# Patient Record
Sex: Female | Born: 2000 | Race: White | Hispanic: No | Marital: Single | Smoking: Never smoker
Health system: Southern US, Community
[De-identification: ages and names within clinical notes are randomized; demographics above are authoritative.]

## PROBLEM LIST (undated history)

## (undated) DIAGNOSIS — R569 Unspecified convulsions: Secondary | ICD-10-CM

## (undated) DIAGNOSIS — J45909 Unspecified asthma, uncomplicated: Secondary | ICD-10-CM

## (undated) DIAGNOSIS — L309 Dermatitis, unspecified: Secondary | ICD-10-CM

## (undated) DIAGNOSIS — F909 Attention-deficit hyperactivity disorder, unspecified type: Secondary | ICD-10-CM

---
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
## 2021-08-17 ENCOUNTER — Emergency Department (HOSPITAL_BASED_OUTPATIENT_CLINIC_OR_DEPARTMENT_OTHER): Payer: BC Managed Care – PPO

## 2021-08-17 ENCOUNTER — Encounter (HOSPITAL_BASED_OUTPATIENT_CLINIC_OR_DEPARTMENT_OTHER): Payer: Self-pay | Admitting: Emergency Medicine

## 2021-08-17 ENCOUNTER — Emergency Department (HOSPITAL_BASED_OUTPATIENT_CLINIC_OR_DEPARTMENT_OTHER)
Admission: EM | Admit: 2021-08-17 | Discharge: 2021-08-17 | Disposition: A | Discharge status: Home / Self Care | Payer: BC Managed Care – PPO | Attending: Emergency Medicine | Admitting: Emergency Medicine

## 2021-08-17 ENCOUNTER — Other Ambulatory Visit: Payer: Self-pay

## 2021-08-17 DIAGNOSIS — W208XXA Other cause of strike by thrown, projected or falling object, initial encounter: Secondary | ICD-10-CM | POA: Insufficient documentation

## 2021-08-17 DIAGNOSIS — S92414A Nondisplaced fracture of proximal phalanx of right great toe, initial encounter for closed fracture: Secondary | ICD-10-CM | POA: Diagnosis not present

## 2021-08-17 DIAGNOSIS — S99921A Unspecified injury of right foot, initial encounter: Secondary | ICD-10-CM | POA: Diagnosis present

## 2021-08-17 MED ORDER — NAPROXEN 375 MG PO TABS: 375.0000 mg | ORAL_TABLET | Freq: Two times a day (BID) | ORAL | 0 refills | Status: AC

## 2021-08-17 MED ORDER — ACETAMINOPHEN 325 MG PO TABS
650.0000 mg | ORAL_TABLET | Freq: Once | ORAL | Status: AC | PRN
  Administered 2021-08-17: 650 mg via ORAL
  Filled 2021-08-17: qty 2

## 2021-08-17 NOTE — ED Triage Notes (Signed)
Pt arrives pov, to triage in wheelchair, c/o right great toe injury r/t heavy box dropping on toe. Swelling and bruising noted

## 2021-08-17 NOTE — Discharge Instructions (Signed)
The x-rays showed a fracture of the bone in your big toe.  Use the hard soled shoe for comfort and support.  Use the crutches to help take the weight off of your toe.  Follow-up with your orthopedic doctor for further treatment to make sure the fracture heals properly

## 2021-08-17 NOTE — ED Provider Notes (Signed)
[facility] EMERGENCY DEPARTMENT Provider Note   CSN: [id number] Arrival date & time: 08/17/21  1358     History  Chief Complaint  Patient presents with   Toe Injury    Claudia Brock is a 20 y.o. female.  HPI  Patient presents to the ED for evaluation of a toe injury.  Patient states she dropped a heavy box landing on her right big toe.  Patient is having pain at the base of the big toe.  It hurts to walk and put weight and she also started to notice a bruise developing.  Home Medications Prior to Admission medications   Medication Sig Start Date End Date Taking? Authorizing Provider  naproxen (NAPROSYN) 375 MG tablet Take 1 tablet (375 mg total) by mouth 2 (two) times daily. 08/17/21  Yes Linwood Dibbles, MD  beclomethasone (QVAR) 80 MCG/ACT inhaler Inhale 2 puffs into the lungs 2 (two) times daily.    [provider]  cephALEXin (KEFLEX) 500 MG capsule Take 1 capsule (500 mg total) by mouth 2 (two) times daily. 03/06/18   Garlon Hatchet, PA-C  cetirizine (ZYRTEC) 10 MG tablet Take 10 mg by mouth daily.    [provider]  guaiFENesin-dextromethorphan (ROBITUSSIN DM) 100-10 MG/5ML syrup Take 5 mLs by mouth every 4 (four) hours as needed for cough. 10/26/14   Felicie Morn, NP  montelukast (SINGULAIR) 10 MG tablet Take 10 mg by mouth at bedtime.    [provider]  phenazopyridine (PYRIDIUM) 200 MG tablet Take 1 tablet (200 mg total) by mouth 3 (three) times daily as needed for pain. 03/06/18   Garlon Hatchet, PA-C      Allergies    Ritalin [methylphenidate hcl]    Review of Systems   Review of Systems  Physical Exam Updated Vital Signs BP 128/78   Pulse 92   Temp 100.2 F (37.9 C) (Oral)   Resp 20   Ht 1.638 m (5' 4.5")   Wt 55.8 kg   SpO2 96%   BMI 20.79 kg/m  Physical Exam Vitals and nursing note reviewed.  Constitutional:      General: She is not in acute distress.    Appearance: She is well-developed.  HENT:     Head:  Normocephalic and atraumatic.     Right Ear: External ear normal.     Left Ear: External ear normal.  Eyes:     General: No scleral icterus.       Right eye: No discharge.        Left eye: No discharge.     Conjunctiva/sclera: Conjunctivae normal.  Neck:     Trachea: No tracheal deviation.  Cardiovascular:     Rate and Rhythm: Normal rate.  Pulmonary:     Effort: Pulmonary effort is normal. No respiratory distress.     Breath sounds: No stridor.  Abdominal:     General: There is no distension.  Musculoskeletal:        General: Tenderness present. No swelling or deformity.     Cervical back: Neck supple.     Comments: Bruising noted at the base of the big toe, dark toenail polish but no evidence of subungual hematoma noted at the base of the toenail where there was noted toenail polish, no laceration, no gross deformity, cap refill brisk  Skin:    General: Skin is warm and dry.     Findings: No rash.  Neurological:     Mental Status: She is alert.  Cranial Nerves: Cranial nerve deficit: no gross deficits.     ED Results / Procedures / Treatments   Labs (all labs ordered are listed, but only abnormal results are displayed) Labs Reviewed - No data to display  EKG None  Radiology DG Foot Complete Right  Result Date: 08/17/2021 CLINICAL DATA:  Dropped a box on great toe last night EXAM: RIGHT FOOT COMPLETE - 3+ VIEW COMPARISON:  None Available. FINDINGS: There is a nondisplaced fracture of the great toe proximal phalanx at the interphalangeal joint, along the lateral aspect with joint involvement. Normal joint alignment. There is adjacent soft tissue swelling. IMPRESSION: Nondisplaced fracture of the great toe proximal phalanx at the interphalangeal joint. Electronically Signed   By: Caprice Renshaw M.D.   On: 08/17/2021 14:40    Procedures Procedures    Medications Ordered in ED Medications  acetaminophen (TYLENOL) tablet 650 mg (650 mg Oral Given 08/17/21 1422)    ED  Course/ Medical Decision Making/ A&P                           Medical Decision Making Problems Addressed: Closed nondisplaced fracture of proximal phalanx of right great toe, initial encounter: acute illness or injury  Amount and/or Complexity of Data Reviewed Radiology: ordered and independent interpretation performed.    Details: Nondisplaced fracture noted of the proximal phalanx of the great toe  Risk OTC drugs. Prescription drug management.  X-rays show evidence of proximal phalanx fracture of the big toe.  Will buddy tape, provide postop shoe and crutches.  Outpatient follow-up with orthopedics Evaluation and diagnostic testing in the emergency department does not suggest an emergent condition requiring admission or immediate intervention beyond what has been performed at this time.  The patient is safe for discharge and has been instructed to return immediately for worsening symptoms, change in symptoms or any other concerns.         Final Clinical Impression(s) / ED Diagnoses Final diagnoses:  Closed nondisplaced fracture of proximal phalanx of right great toe, initial encounter    Rx / DC Orders ED Discharge Orders          Ordered    naproxen (NAPROSYN) 375 MG tablet  2 times daily        08/17/21 1445              Linwood Dibbles, MD 08/17/21 1448

## 2021-09-07 ENCOUNTER — Encounter (HOSPITAL_BASED_OUTPATIENT_CLINIC_OR_DEPARTMENT_OTHER): Payer: Self-pay | Admitting: Emergency Medicine

## 2021-09-07 ENCOUNTER — Other Ambulatory Visit: Payer: Self-pay

## 2021-09-07 ENCOUNTER — Emergency Department (HOSPITAL_BASED_OUTPATIENT_CLINIC_OR_DEPARTMENT_OTHER)
Admission: EM | Admit: 2021-09-07 | Discharge: 2021-09-07 | Disposition: A | Discharge status: Home / Self Care | Payer: BC Managed Care – PPO | Attending: Emergency Medicine | Admitting: Emergency Medicine

## 2021-09-07 DIAGNOSIS — N898 Other specified noninflammatory disorders of vagina: Secondary | ICD-10-CM | POA: Diagnosis present

## 2021-09-07 DIAGNOSIS — B9689 Other specified bacterial agents as the cause of diseases classified elsewhere: Secondary | ICD-10-CM

## 2021-09-07 DIAGNOSIS — N760 Acute vaginitis: Secondary | ICD-10-CM | POA: Insufficient documentation

## 2021-09-07 LAB — WET PREP, GENITAL
Sperm: NONE SEEN
Trich, Wet Prep: NONE SEEN
WBC, Wet Prep HPF POC: >=10 — AB (ref <10)
Yeast Wet Prep HPF POC: NONE SEEN

## 2021-09-07 LAB — URINALYSIS, ROUTINE W REFLEX MICROSCOPIC
Bilirubin Urine: NEGATIVE
Glucose, UA: NEGATIVE mg/dL
Ketones, ur: NEGATIVE mg/dL
Nitrite: NEGATIVE
Protein, ur: NEGATIVE mg/dL
Specific Gravity, Urine: 1.025 (ref 1.005–1.030)
pH: 6.5 (ref 5.0–8.0)

## 2021-09-07 LAB — URINALYSIS, MICROSCOPIC (REFLEX): WBC, UA: >50 WBC/hpf (ref 0–5)

## 2021-09-07 LAB — PREGNANCY, URINE: Preg Test, Ur: NEGATIVE

## 2021-09-07 MED ORDER — METRONIDAZOLE 500 MG PO TABS: 500.0000 mg | ORAL_TABLET | Freq: Two times a day (BID) | ORAL | 0 refills | Status: DC

## 2021-09-07 MED ORDER — METRONIDAZOLE 500 MG PO TABS
500.0000 mg | ORAL_TABLET | Freq: Once | ORAL | Status: AC
  Administered 2021-09-07: 500 mg via ORAL
  Filled 2021-09-07: qty 1

## 2021-09-07 NOTE — ED Triage Notes (Signed)
Pt arrives pov, steady gait, c/o vaginal d/c and odor x 2 weeks.

## 2021-09-07 NOTE — ED Provider Notes (Signed)
[facility] EMERGENCY DEPARTMENT Provider Note   CSN: [id number] Arrival date & time: 09/07/21  6546     History  Chief Complaint  Patient presents with   Vaginal Discharge    Claudia Brock is a 21 y.o. female presenting with 2 weeks worth of vaginal discharge and odor.  Sexually active with 1 partner.  Has had recent STD testing that was negative.  No abdominal pain, cramping or abnormal menses.  No urinary symptoms.  Vaginal Discharge      Home Medications Prior to Admission medications   Medication Sig Start Date End Date Taking? Authorizing Provider  beclomethasone (QVAR) 80 MCG/ACT inhaler Inhale 2 puffs into the lungs 2 (two) times daily.    [provider]  cephALEXin (KEFLEX) 500 MG capsule Take 1 capsule (500 mg total) by mouth 2 (two) times daily. 03/06/18   Garlon Hatchet, PA-C  cetirizine (ZYRTEC) 10 MG tablet Take 10 mg by mouth daily.    [provider]  guaiFENesin-dextromethorphan (ROBITUSSIN DM) 100-10 MG/5ML syrup Take 5 mLs by mouth every 4 (four) hours as needed for cough. 10/26/14   Felicie Morn, NP  montelukast (SINGULAIR) 10 MG tablet Take 10 mg by mouth at bedtime.    [provider]  naproxen (NAPROSYN) 375 MG tablet Take 1 tablet (375 mg total) by mouth 2 (two) times daily. 08/17/21   Linwood Dibbles, MD  phenazopyridine (PYRIDIUM) 200 MG tablet Take 1 tablet (200 mg total) by mouth 3 (three) times daily as needed for pain. 03/06/18   Garlon Hatchet, PA-C      Allergies    Ritalin [methylphenidate hcl]    Review of Systems   Review of Systems  Genitourinary:  Positive for vaginal discharge.    Physical Exam Updated Vital Signs BP 126/88 (BP Location: Right Arm)   Pulse 96   Temp 99.8 F (37.7 C) (Oral)   Resp 16   Wt 55.8 kg   LMP 08/28/2021   SpO2 96%   BMI 20.79 kg/m  Physical Exam Vitals and nursing note reviewed.  Constitutional:      Appearance: Normal appearance.  HENT:     Head: Normocephalic and  atraumatic.  Eyes:     General: No scleral icterus.    Conjunctiva/sclera: Conjunctivae normal.  Pulmonary:     Effort: Pulmonary effort is normal. No respiratory distress.  Abdominal:     General: Abdomen is flat.     Palpations: Abdomen is soft.     Tenderness: There is no abdominal tenderness.  Genitourinary:    Comments: GU exam deferred.  Offered but declined. Skin:    Findings: No rash.  Neurological:     Mental Status: She is alert.  Psychiatric:        Mood and Affect: Mood normal.     ED Results / Procedures / Treatments   Labs (all labs ordered are listed, but only abnormal results are displayed) Labs Reviewed  WET PREP, GENITAL - Abnormal; Notable for the following components:      Result Value   Clue Cells Wet Prep HPF POC PRESENT (*)    WBC, Wet Prep HPF POC >=10 (*)    All other components within normal limits  URINALYSIS, ROUTINE W REFLEX MICROSCOPIC - Abnormal; Notable for the following components:   Hgb urine dipstick TRACE (*)    Leukocytes,Ua SMALL (*)    All other components within normal limits  URINALYSIS, MICROSCOPIC (REFLEX) - Abnormal; Notable for the following components:  Bacteria, UA MANY (*)    All other components within normal limits  PREGNANCY, URINE  GC/CHLAMYDIA PROBE AMP ([place]) NOT AT [facility]    EKG None  Radiology No results found.  Procedures Procedures   Medications Ordered in ED Medications  metroNIDAZOLE (FLAGYL) tablet 500 mg (500 mg Oral Given 09/07/21 2014)    ED Course/ Medical Decision Making/ A&P                           Medical Decision Making Amount and/or Complexity of Data Reviewed Labs: ordered.  Risk Prescription drug management.   21 year old female presenting today with vaginal discharge.  Reports it is copious and odorous.  Differential includes but is not limited to BV, yeast infection, STI/PID/TOA.  Physical exam offered but deferred.  Patient self swabbed.  Swabs ordered and reviewed by  me.  Clue cells are negative for BV.  UA negative  Treatment: Given first dose of metronidazole and will be discharged with the remainder of the course.  Return precautions discussed.  Final Clinical Impression(s) / ED Diagnoses Final diagnoses:  BV (bacterial vaginosis)    Rx / DC Orders ED Discharge Orders     None     Results and diagnoses were explained to the patient. Return precautions discussed in full. Patient had no additional questions and expressed complete understanding.   This chart was dictated using voice recognition software.  Despite best efforts to proofread,  errors can occur which can change the documentation meaning.     Saddie Benders, PA-C 09/07/21 2019    Tanda Rockers A, DO 09/09/21 [phone]

## 2021-09-07 NOTE — Discharge Instructions (Addendum)
Read the information about bacterial vaginosis attached to these discharge papers.  Take the antibiotic as prescribed.  It may upset your stomach so please take it with food.  It is very important that you do not drink alcohol with this antibiotic.  Return with any worsening symptoms, otherwise please follow-up with your GYN.

## 2021-09-08 LAB — GC/CHLAMYDIA PROBE AMP (~~LOC~~) NOT AT ARMC
Chlamydia: NEGATIVE
Comment: NEGATIVE
Comment: NORMAL
Neisseria Gonorrhea: NEGATIVE

## 2022-03-09 ENCOUNTER — Ambulatory Visit: Payer: BC Managed Care – PPO | Admitting: Behavioral Health

## 2022-03-12 ENCOUNTER — Ambulatory Visit (INDEPENDENT_AMBULATORY_CARE_PROVIDER_SITE_OTHER): Payer: BC Managed Care – PPO | Admitting: Behavioral Health

## 2022-03-12 DIAGNOSIS — F331 Major depressive disorder, recurrent, moderate: Secondary | ICD-10-CM

## 2022-03-12 DIAGNOSIS — F4310 Post-traumatic stress disorder, unspecified: Secondary | ICD-10-CM | POA: Diagnosis not present

## 2022-03-12 DIAGNOSIS — F411 Generalized anxiety disorder: Secondary | ICD-10-CM | POA: Diagnosis not present
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
## 2022-03-16 ENCOUNTER — Encounter: Payer: Self-pay | Admitting: Behavioral Health

## 2022-03-17 ENCOUNTER — Ambulatory Visit (INDEPENDENT_AMBULATORY_CARE_PROVIDER_SITE_OTHER): Payer: BC Managed Care – PPO | Admitting: Psychiatry

## 2022-03-17 VITALS — BP 132/87 | HR 95 | Ht 67.0 in | Wt 116.0 lb

## 2022-03-17 DIAGNOSIS — F331 Major depressive disorder, recurrent, moderate: Secondary | ICD-10-CM | POA: Diagnosis not present

## 2022-03-17 DIAGNOSIS — F4310 Post-traumatic stress disorder, unspecified: Secondary | ICD-10-CM | POA: Diagnosis not present

## 2022-03-17 DIAGNOSIS — F411 Generalized anxiety disorder: Secondary | ICD-10-CM

## 2022-03-17 MED ORDER — MIRTAZAPINE 15 MG PO TABS
15.0000 mg | ORAL_TABLET | Freq: Every day | ORAL | 1 refills | Status: DC
Start: 2022-03-17 — End: 2022-04-09

## 2022-03-18 ENCOUNTER — Encounter: Payer: Self-pay | Admitting: Psychiatry

## 2022-03-18 DIAGNOSIS — F4310 Post-traumatic stress disorder, unspecified: Secondary | ICD-10-CM | POA: Insufficient documentation

## 2022-03-18 DIAGNOSIS — F331 Major depressive disorder, recurrent, moderate: Secondary | ICD-10-CM | POA: Insufficient documentation

## 2022-03-18 DIAGNOSIS — F411 Generalized anxiety disorder: Secondary | ICD-10-CM | POA: Insufficient documentation
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
## 2022-03-20 ENCOUNTER — Ambulatory Visit (INDEPENDENT_AMBULATORY_CARE_PROVIDER_SITE_OTHER): Payer: BC Managed Care – PPO | Admitting: Behavioral Health

## 2022-03-20 DIAGNOSIS — F909 Attention-deficit hyperactivity disorder, unspecified type: Secondary | ICD-10-CM | POA: Diagnosis not present

## 2022-03-20 DIAGNOSIS — F331 Major depressive disorder, recurrent, moderate: Secondary | ICD-10-CM

## 2022-03-20 DIAGNOSIS — F411 Generalized anxiety disorder: Secondary | ICD-10-CM | POA: Diagnosis not present

## 2022-03-20 NOTE — Progress Notes (Signed)
[facility] Counselor/Therapist Progress Note  Patient ID: ASTORIA ASKARI, MRN: [id number],    Date: 03/20/2022  Time Spent: 60 minutes   Treatment Type: Individual Therapy  Reported Symptoms: Anxiety   Mental Status Exam:  Appearance:   Casual     Behavior:  Appropriate and Sharing  Motor:  Normal  Speech/Language:   Clear and Coherent  Affect:  Appropriate and Congruent  Mood:  anxious  Thought process:  normal  Thought content:    WNL  Sensory/Perceptual disturbances:    WNL  Orientation:  oriented to person  Attention:  Good  Concentration:  Good  Memory:  WNL  Fund of knowledge:   Good  Insight:    Good  Judgment:   Good  Impulse Control:  Good   Risk Assessment: Danger to Self:  No Self-injurious Behavior: No Danger to Others: No Duty to Warn:no Physical Aggression / Violence:No  Access to Firearms a concern: No  Gang Involvement:No   Subjective:   Patient expressed gratitude with soon starting a new job. She states plans to save up money to purchase a car and plan to eventually return back to school. The patient states to experience anxiety with starting a new job. She reports she has a history of being diagnosed with ADHD and reports having trouble with her memory. The patient expressed concerns of having memory cognitive deficits that may interfer with her ability to learn her new job. The patient expressed concerns of having personal goals for herself that she feels overwhelmed by. She prioritized her personal goals in session with this counselor. The patient identified her mom as support. She reports attempting to "Stay positive instead of being in the negative". The patient denied SI/HI, AH/VH.   Counselor condcuted check in. Counselor provided assistance with creating treatment goals for therapy. Counselor utlized reflective listening and validated the patients concerns. Counselor discussed memory techniques with the patient to address her concerns and  provided the patient with psychotherapy educational information titled Memory Enhancement Techniques to assist her with enhancing her cognitive deficits. Counselor assessed for SI/HI, AH/VH.    Interventions: Motivational Interviewing  Diagnosis:   ICD-10-CM   1. Generalized anxiety disorder  F41.1     2. Attention deficit hyperactivity disorder (ADHD), unspecified ADHD type  F90.9     3. Major depressive disorder, recurrent episode, moderate (HCC)  F33.1       Plan:   1) Long Term Goal: Develop strategies to reduce symptoms.       Short Term Goal: Develop strategies for thought distraction when fixating on the future.      Objective: Manage panic attack symptoms that occur once a week.     Objective: Learn two new ways of coping with routine stressors.    2) Long Term Goal: Sustain attention and concentration for consistently longer periods of time.      Short Term Goal: Minimize ADHD behavioral interference in daily life.     Objective: Take psychotropic medication as prescribed on a regular consistent basis.     Objective: Learn coping skills that will be used to manage ADHD symptoms.    3) Long Term Goal: Alleviate depressive symptoms and return to previous level of effective functioning.     Short Term Goal: Improve overall mood.     Objective: Get through a day/week without a crying spell.     Objective: Develop strategies for thought distraction when ruminating on the past.     Objective: Call  crisis hotline if having suicidal thoughts.     Jannifer Hick, [facility]

## 2022-03-23 ENCOUNTER — Encounter: Payer: Self-pay | Admitting: Behavioral Health

## 2022-04-05 ENCOUNTER — Emergency Department (HOSPITAL_BASED_OUTPATIENT_CLINIC_OR_DEPARTMENT_OTHER)
Admission: EM | Admit: 2022-04-05 | Discharge: 2022-04-05 | Disposition: A | Discharge status: Home / Self Care | Payer: BC Managed Care – PPO | Attending: Emergency Medicine | Admitting: Emergency Medicine

## 2022-04-05 ENCOUNTER — Emergency Department (HOSPITAL_BASED_OUTPATIENT_CLINIC_OR_DEPARTMENT_OTHER): Payer: BC Managed Care – PPO

## 2022-04-05 ENCOUNTER — Other Ambulatory Visit: Payer: Self-pay

## 2022-04-05 DIAGNOSIS — N3000 Acute cystitis without hematuria: Secondary | ICD-10-CM | POA: Diagnosis not present

## 2022-04-05 DIAGNOSIS — R509 Fever, unspecified: Secondary | ICD-10-CM | POA: Diagnosis present

## 2022-04-05 DIAGNOSIS — B9689 Other specified bacterial agents as the cause of diseases classified elsewhere: Secondary | ICD-10-CM | POA: Insufficient documentation

## 2022-04-05 DIAGNOSIS — N750 Cyst of Bartholin's gland: Secondary | ICD-10-CM | POA: Diagnosis not present

## 2022-04-05 DIAGNOSIS — N760 Acute vaginitis: Secondary | ICD-10-CM | POA: Insufficient documentation

## 2022-04-05 DIAGNOSIS — U071 COVID-19: Secondary | ICD-10-CM | POA: Insufficient documentation

## 2022-04-05 DIAGNOSIS — A419 Sepsis, unspecified organism: Secondary | ICD-10-CM | POA: Diagnosis not present

## 2022-04-05 LAB — WET PREP, GENITAL
Sperm: NONE SEEN
Trich, Wet Prep: NONE SEEN
WBC, Wet Prep HPF POC: >=10 — AB (ref <10)
Yeast Wet Prep HPF POC: NONE SEEN

## 2022-04-05 LAB — RESP PANEL BY RT-PCR (RSV, FLU A&B, COVID)  RVPGX2
Influenza A by PCR: NEGATIVE
Influenza B by PCR: NEGATIVE
Resp Syncytial Virus by PCR: NEGATIVE
SARS Coronavirus 2 by RT PCR: POSITIVE — AB

## 2022-04-05 LAB — BASIC METABOLIC PANEL
Anion gap: 6 (ref 5–15)
BUN: 10 mg/dL (ref 6–20)
CO2: 23 mmol/L (ref 22–32)
Calcium: 8.7 mg/dL — ABNORMAL LOW (ref 8.9–10.3)
Chloride: 102 mmol/L (ref 98–111)
Creatinine, Ser: 0.58 mg/dL (ref 0.44–1.00)
GFR, Estimated: >60 mL/min (ref >60)
Glucose, Bld: 107 mg/dL — ABNORMAL HIGH (ref 70–99)
Potassium: 3.4 mmol/L — ABNORMAL LOW (ref 3.5–5.1)
Sodium: 131 mmol/L — ABNORMAL LOW (ref 135–145)

## 2022-04-05 LAB — CBC WITH DIFFERENTIAL/PLATELET
Abs Immature Granulocytes: 0.08 10*3/uL — ABNORMAL HIGH (ref 0.00–0.07)
Basophils Absolute: 0 10*3/uL (ref 0.0–0.1)
Basophils Relative: 0 %
Eosinophils Absolute: 0 10*3/uL (ref 0.0–0.5)
Eosinophils Relative: 0 %
HCT: 38.8 % (ref 36.0–46.0)
Hemoglobin: 13 g/dL (ref 12.0–15.0)
Immature Granulocytes: 0 %
Lymphocytes Relative: 10 %
Lymphs Abs: 1.9 10*3/uL (ref 0.7–4.0)
MCH: 29 pg (ref 26.0–34.0)
MCHC: 33.5 g/dL (ref 30.0–36.0)
MCV: 86.4 fL (ref 80.0–100.0)
Monocytes Absolute: 1.4 10*3/uL — ABNORMAL HIGH (ref 0.1–1.0)
Monocytes Relative: 8 %
Neutro Abs: 15.2 10*3/uL — ABNORMAL HIGH (ref 1.7–7.7)
Neutrophils Relative %: 82 %
Platelets: 302 10*3/uL (ref 150–400)
RBC: 4.49 MIL/uL (ref 3.87–5.11)
RDW: 11.9 % (ref 11.5–15.5)
WBC: 18.6 10*3/uL — ABNORMAL HIGH (ref 4.0–10.5)
nRBC: 0 % (ref 0.0–0.2)

## 2022-04-05 LAB — URINALYSIS, MICROSCOPIC (REFLEX)

## 2022-04-05 LAB — URINALYSIS, ROUTINE W REFLEX MICROSCOPIC
Bilirubin Urine: NEGATIVE
Glucose, UA: NEGATIVE mg/dL
Ketones, ur: NEGATIVE mg/dL
Nitrite: NEGATIVE
Protein, ur: 30 mg/dL — AB
Specific Gravity, Urine: >=1.03 (ref 1.005–1.030)
pH: 6 (ref 5.0–8.0)

## 2022-04-05 LAB — PROTIME-INR
INR: 1 (ref 0.8–1.2)
Prothrombin Time: 13.4 seconds (ref 11.4–15.2)

## 2022-04-05 LAB — APTT: aPTT: 35 seconds (ref 24–36)

## 2022-04-05 LAB — LACTIC ACID, PLASMA: Lactic Acid, Venous: 1 mmol/L (ref 0.5–1.9)

## 2022-04-05 LAB — PREGNANCY, URINE: Preg Test, Ur: NEGATIVE

## 2022-04-05 MED ORDER — IBUPROFEN 400 MG PO TABS
600.0000 mg | ORAL_TABLET | Freq: Once | ORAL | Status: AC
  Administered 2022-04-05: 600 mg via ORAL
  Filled 2022-04-05: qty 1

## 2022-04-05 MED ORDER — LACTATED RINGERS IV BOLUS (SEPSIS)
500.0000 mL | Freq: Once | INTRAVENOUS | Status: AC
  Administered 2022-04-05: 500 mL via INTRAVENOUS

## 2022-04-05 MED ORDER — LORAZEPAM 2 MG/ML IJ SOLN
1.0000 mg | Freq: Once | INTRAMUSCULAR | Status: AC
  Administered 2022-04-05: 1 mg via INTRAVENOUS
  Filled 2022-04-05: qty 1

## 2022-04-05 MED ORDER — METRONIDAZOLE 500 MG PO TABS: 500.0000 mg | ORAL_TABLET | Freq: Two times a day (BID) | ORAL | Status: DC

## 2022-04-05 MED ORDER — SODIUM CHLORIDE 0.9 % IV SOLN
2.0000 g | Freq: Once | INTRAVENOUS | Status: AC
  Administered 2022-04-05: 2 g via INTRAVENOUS
  Filled 2022-04-05: qty 20

## 2022-04-05 MED ORDER — LACTATED RINGERS IV SOLN: INTRAVENOUS | Status: DC

## 2022-04-05 MED ORDER — LIDOCAINE HCL (PF) 1 % IJ SOLN
30.0000 mL | Freq: Once | INTRAMUSCULAR | Status: AC
  Administered 2022-04-05: 30 mL
  Filled 2022-04-05: qty 30

## 2022-04-05 MED ORDER — MORPHINE SULFATE (PF) 4 MG/ML IV SOLN
4.0000 mg | Freq: Once | INTRAVENOUS | Status: AC
  Administered 2022-04-05: 4 mg via INTRAVENOUS
  Filled 2022-04-05: qty 1

## 2022-04-05 MED ORDER — METRONIDAZOLE 500 MG/100ML IV SOLN
500.0000 mg | Freq: Once | INTRAVENOUS | Status: AC
  Administered 2022-04-05: 500 mg via INTRAVENOUS
  Filled 2022-04-05: qty 100

## 2022-04-05 MED ORDER — CEPHALEXIN 500 MG PO CAPS: 500.0000 mg | ORAL_CAPSULE | Freq: Three times a day (TID) | ORAL | 0 refills | Status: AC

## 2022-04-05 MED ORDER — LACTATED RINGERS IV BOLUS (SEPSIS)
1000.0000 mL | Freq: Once | INTRAVENOUS | Status: AC
  Administered 2022-04-05: 1000 mL via INTRAVENOUS

## 2022-04-05 MED ORDER — METRONIDAZOLE 500 MG PO TABS: 500.0000 mg | ORAL_TABLET | Freq: Two times a day (BID) | ORAL | 0 refills | Status: DC

## 2022-04-05 MED ORDER — LACTATED RINGERS IV BOLUS (SEPSIS)
250.0000 mL | Freq: Once | INTRAVENOUS | Status: AC
  Administered 2022-04-05: 250 mL via INTRAVENOUS

## 2022-04-05 MED ORDER — IOHEXOL 300 MG/ML  SOLN
80.0000 mL | Freq: Once | INTRAMUSCULAR | Status: AC | PRN
  Administered 2022-04-05: 80 mL via INTRAVENOUS

## 2022-04-05 NOTE — ED Provider Notes (Signed)
[facility] [place] Provider Note   CSN: [id number] Arrival date & time: 04/05/22  1515     History  Chief Complaint  Patient presents with   Abscess    Claudia Brock is a 22 y.o. female presenting today with concern for a vaginal abscess.  She reports that it has been present for 4 days but is getting more more painful.  She was supposed to see her OB/GYN on the 22nd however they had to reschedule her appointment.  Ultimately she saw urgent care 3 days ago and they prescribed her antibiotics but she never picked them up because she did not have a ride.  Reports being sexually active with 1 female partner.  Uses barrier protection, has an IUD so she is unsure when her last menstrual period was.  Denies any abnormal discharge or odors.  Does report some dysuria but she thinks it is coming from the "cyst."   Does endorse some subjective fevers, congestion, rhinorrhea and cough over the past couple of days.   Abscess      Home Medications Prior to Admission medications   Medication Sig Start Date End Date Taking? Authorizing Provider  beclomethasone (QVAR) 80 MCG/ACT inhaler Inhale 2 puffs into the lungs 2 (two) times daily.    [provider]  cephALEXin (KEFLEX) 500 MG capsule Take 1 capsule (500 mg total) by mouth 2 (two) times daily. 03/06/18   Larene Pickett, PA-C  cetirizine (ZYRTEC) 10 MG tablet Take 10 mg by mouth daily.    [provider]  guaiFENesin-dextromethorphan (ROBITUSSIN DM) 100-10 MG/5ML syrup Take 5 mLs by mouth every 4 (four) hours as needed for cough. 10/26/14   Etta Quill, NP  metroNIDAZOLE (FLAGYL) 500 MG tablet Take 1 tablet (500 mg total) by mouth 2 (two) times daily. 09/07/21   Wilberth Damon A, PA-C  mirtazapine (REMERON) 15 MG tablet Take 1 tablet (15 mg total) by mouth at bedtime. 03/17/22   Acquanetta Belling, MD  montelukast (SINGULAIR) 10 MG tablet Take 10 mg by mouth at bedtime.    [provider]  naproxen (NAPROSYN) 375 MG tablet Take 1 tablet (375 mg total) by mouth 2 (two) times daily. 08/17/21   Dorie Rank, MD  phenazopyridine (PYRIDIUM) 200 MG tablet Take 1 tablet (200 mg total) by mouth 3 (three) times daily as needed for pain. 03/06/18   Larene Pickett, PA-C      Allergies    Ritalin [methylphenidate hcl]    Review of Systems   Review of Systems  Physical Exam Updated Vital Signs BP 134/85 (BP Location: Left Arm)   Pulse (!) 132   Temp (!) 102.7 F (39.3 C) (Oral)   Resp 20   Ht '5\' 6"'$  (1.676 m)   Wt 53.5 kg   SpO2 98%   BMI 19.05 kg/m  Physical Exam Vitals and nursing note reviewed.  Constitutional:      Appearance: Normal appearance.     Comments: Patient is inconsolable.  She is screaming, sobbing and rolling around on the stretcher anytime anybody tries to touch her.   HENT:     Head: Normocephalic and atraumatic.  Eyes:     General: No scleral icterus.    Conjunctiva/sclera: Conjunctivae normal.  Pulmonary:     Effort: Pulmonary effort is normal. No respiratory distress.  Genitourinary:    Comments: GU exam performed in presence of 2 nurse techs and 2 RNs as well as patient's mother.  Appears to have a right-sided Bartholin's gland abscess.  Does not appear to track deep inside the patient's vagina.  Some induration to the left labia minora but no obvious Bartholin's abscess or areas of fluctuance.  Thin gray discharge in the vaginal vault.  Unable to perform full pelvic exam however no cervical motion tenderness on bimanual or with swabs. Skin:    Findings: No rash.  Neurological:     Mental Status: She is alert.  Psychiatric:        Mood and Affect: Mood normal.     ED Results / Procedures / Treatments   Labs (all labs ordered are listed, but only abnormal results are displayed) Labs Reviewed  RESP PANEL BY RT-PCR (RSV, FLU A&B, COVID)  RVPGX2 - Abnormal; Notable for the following components:      Result Value   SARS Coronavirus 2  by RT PCR POSITIVE (*)    All other components within normal limits  WET PREP, GENITAL - Abnormal; Notable for the following components:   Clue Cells Wet Prep HPF POC PRESENT (*)    WBC, Wet Prep HPF POC >=10 (*)    All other components within normal limits  CBC WITH DIFFERENTIAL/PLATELET - Abnormal; Notable for the following components:   WBC 18.6 (*)    Neutro Abs 15.2 (*)    Monocytes Absolute 1.4 (*)    Abs Immature Granulocytes 0.08 (*)    All other components within normal limits  BASIC METABOLIC PANEL - Abnormal; Notable for the following components:   Sodium 131 (*)    Potassium 3.4 (*)    Glucose, Bld 107 (*)    Calcium 8.7 (*)    All other components within normal limits  URINALYSIS, ROUTINE W REFLEX MICROSCOPIC - Abnormal; Notable for the following components:   APPearance CLOUDY (*)    Hgb urine dipstick TRACE (*)    Protein, ur 30 (*)    Leukocytes,Ua SMALL (*)    All other components within normal limits  URINALYSIS, MICROSCOPIC (REFLEX) - Abnormal; Notable for the following components:   Bacteria, UA MANY (*)    All other components within normal limits  CULTURE, BLOOD (ROUTINE X 2)  CULTURE, BLOOD (ROUTINE X 2)  URINE CULTURE  LACTIC ACID, PLASMA  PROTIME-INR  APTT  PREGNANCY, URINE  URINALYSIS, W/ REFLEX TO CULTURE (INFECTION SUSPECTED)  GC/CHLAMYDIA PROBE AMP ([place]) NOT AT [facility]    EKG None  Radiology CT ABDOMEN PELVIS W CONTRAST  Result Date: 04/05/2022 CLINICAL DATA:  Sepsis.  Recent labial abscess which was drained. EXAM: CT ABDOMEN AND PELVIS WITH CONTRAST TECHNIQUE: Multidetector CT imaging of the abdomen and pelvis was performed using the standard protocol following bolus administration of intravenous contrast. RADIATION DOSE REDUCTION: This exam was performed according to the departmental dose-optimization program which includes automated exposure control, adjustment of the mA and/or kV according to patient size and/or use of iterative  reconstruction technique. CONTRAST:  7m OMNIPAQUE IOHEXOL 300 MG/ML  SOLN COMPARISON:  None Available. FINDINGS: Lower Chest: No acute findings. Hepatobiliary: No hepatic masses identified. Gallbladder is unremarkable. No evidence of biliary ductal dilatation. Pancreas:  No mass or inflammatory changes. Spleen: Within normal limits in size and appearance. Adrenals/Urinary Tract: No suspicious masses identified. Mild right renal pelvicaliectasis and proximal ureterectasis is seen, however there is no evidence of ureteral calculi. Unremarkable unopacified urinary bladder. Stomach/Bowel: No evidence of obstruction, inflammatory process or abnormal fluid collections. Although the appendix is not directly visualized, no inflammatory process seen in  region of the cecum or elsewhere. Vascular/Lymphatic: No pathologically enlarged lymph nodes. No acute vascular findings. Reproductive: IUD is seen in appropriate position. A simple cyst is seen in the left adnexa measuring 3.5 x 2.1 cm. No evidence of inflammatory process or free fluid. Other: No focal inflammatory process or abscess identified within the labia or perineal soft tissues. Musculoskeletal:  No suspicious bone lesions identified. IMPRESSION: No evidence of abdominal or pelvic abscess. 3.5 cm benign-appearing left adnexal cyst. No follow-up imaging recommended. Note: This recommendation does not apply to premenarchal patients and to those with increased risk (genetic, family history, elevated tumor markers or other high-risk factors) of ovarian cancer. Reference: JACR 2020 Feb; 17(2):248-254 Mild right renal pelvicaliectasis and proximal ureterectasis, without evidence of ureteral calculi. This could be due to a recently passed stone or vesicoureteral reflux. Electronically Signed   By: Marlaine Hind M.D.   On: 04/05/2022 17:56    Procedures .Marland KitchenIncision and Drainage  Date/Time: 04/05/2022 5:00 PM  Performed by: Rhae Hammock, PA-C Authorized by:  Rhae Hammock, PA-C   Consent:    Consent obtained:  Verbal   Consent given by:  Patient   Risks discussed:  Bleeding, incomplete drainage, infection and pain Universal protocol:    Patient identity confirmed:  Verbally with patient Location:    Type:  Bartholin cyst   Size:  2   Location:  Anogenital   Anogenital location:  Bartholin's gland Sedation:    Sedation type:  Anxiolysis Anesthesia:    Anesthesia method:  Local infiltration   Local anesthetic:  Lidocaine 2% w/o epi Procedure type:    Complexity:  Complex Procedure details:    Incision types:  Single straight   Wound management:  Irrigated with saline   Drainage:  Purulent, bloody and serosanguinous   Drainage amount:  Copious   Wound treatment:  Wound left open (Patient able to tolerate word catheter and packing) Post-procedure details:    Procedure completion:  Tolerated with difficulty Comments:     This procedure was very difficult.  Required 4 people to help hold the patient down.  She was still squirming and attempting to kick.  Was able to get copious amount of purulent drainage out of the abscess.  She was unable to tolerate any type of packing.    Medications Ordered in ED Medications  lactated ringers infusion (0 mLs Intravenous Stopped 04/05/22 1708)  lactated ringers infusion ( Intravenous New Bag/Given 04/05/22 1704)  ibuprofen (ADVIL) tablet 600 mg (600 mg Oral Given 04/05/22 1604)  LORazepam (ATIVAN) injection 1 mg (1 mg Intravenous Given 04/05/22 1557)  morphine (PF) 4 MG/ML injection 4 mg (4 mg Intravenous Given 04/05/22 1603)  lactated ringers bolus 1,000 mL (0 mLs Intravenous Stopped 04/05/22 1704)    And  lactated ringers bolus 500 mL (0 mLs Intravenous Stopped 04/05/22 1707)    And  lactated ringers bolus 250 mL (0 mLs Intravenous Stopped 04/05/22 1707)  lactated ringers bolus 1,000 mL (0 mLs Intravenous Stopped 04/05/22 1708)    And  lactated ringers bolus 500 mL (0 mLs Intravenous Stopped  04/05/22 1707)    And  lactated ringers bolus 250 mL (0 mLs Intravenous Stopped 04/05/22 1708)  cefTRIAXone (ROCEPHIN) 2 g in sodium chloride 0.9 % 100 mL IVPB (0 g Intravenous Stopped 04/05/22 1707)  metroNIDAZOLE (FLAGYL) IVPB 500 mg (0 mg Intravenous Stopped 04/05/22 1819)  lidocaine (PF) (XYLOCAINE) 1 % injection 30 mL (30 mLs Other Given by Other 04/05/22 1703)  iohexol (OMNIPAQUE) 300  MG/ML solution 80 mL (80 mLs Intravenous Contrast Given 04/05/22 1722)    ED Course/ Medical Decision Making/ A&P                             Medical Decision Making Amount and/or Complexity of Data Reviewed Labs: ordered. Radiology: ordered. ECG/medicine tests: ordered.  Risk Prescription drug management.   22 year old female presenting today with the concern for Bartholin's gland cyst.  Also endorsing generalized pelvic pain and abdominal pain.  Differential includes but is not limited to Bartholin's gland cyst, vaginitis, BV, STD/PID/TOA, UTI/pyelo-.   This is not an exhaustive differential.    Past Medical History / Co-morbidities / Social History: Severe anxiety   Additional history: Per chart review [facility].  They diagnosed her with a Bartholin gland cyst and prescribed her Bactrim which she did not start.     Physical Exam: Pertinent physical exam findings include Moderately sized right Bartholin's abscess  Lab Tests: I ordered, and personally interpreted labs.  The pertinent results include: WBC 18.6 Urinalysis with leukocytes, bacteria and WBCs.  Also with squamous cells, potentially contaminated  COVID-positive   Imaging Studies: I ordered and independently visualized and interpreted CTAP and I agree with the radiologist that there are no acute findings     Medications: I ordered medication including Ativan, morphine, ibuprofen for fever, Flagyl and Rocephin for abdominal/pelvic source of sepsis.  Flagyl for BV.  Reevaluation of the patient after these  medicines showed that the patient improved.  MDM/Disposition: This is a 22 year old female presenting today with a Bartholin's abscess.  She was seen at urgent care 3 days ago and prescribed Bactrim that she never picked up.  Presents today febrile tachycardic.  Meets sepsis criteria.  She was started on broad-spectrum antibiotics COVID test came back positive which also may be contributing to her fever and tachycardia.  Suspect some of tachycardia also to be secondary to anxiety.  Wet prep positive for BV.  There is some suspicion for PID so CT imaging was ordered.  This imaging did not show any acute infection in the abdomen or pelvis.  At this time I suspect that she did have a Bartholin's abscess however I am less concerned for sepsis in the setting of this and more suspicious of abnormal vital signs in the setting of COVID-19.  Lactic was negative.  Patient reevaluated and says that she feels so much better after drainage.  Heart rate between 95 and 105. Of note, patient's urinalysis was borderline.  Appears to be somewhat of a contaminated sample.  Despite this, patient is having some suprapubic tenderness, dysuria and has abnormal vital signs.  I will treat her for UTI with keflex.  Antibiotics that she has gotten in the department and will be discharged with will overall cover her for PID.  I spoke about her case at bedside with the patient and her mother.  They are agreeable to discharge with strict return precautions.  They will see OB/GYN this week for wound check.  Strict return precautions were given.    I discussed this case with my attending physician Dr. Billy Fischer who cosigned this note including patient's presenting symptoms, physical exam, and planned diagnostics and interventions. Attending physician stated agreement with plan or made changes to plan which were implemented.     Final Clinical Impression(s) / ED Diagnoses Final diagnoses:  Bartholin's cyst  COVID-19  Bacterial  vaginosis  Acute  cystitis without hematuria    Rx / DC Orders ED Discharge Orders          Ordered    metroNIDAZOLE (FLAGYL) 500 MG tablet  2 times daily        04/05/22 1815    cephALEXin (KEFLEX) 500 MG capsule  3 times daily        04/05/22 1815           Results and diagnoses were explained to the patient. Return precautions discussed in full. Patient had no additional questions and expressed complete understanding.   This chart was dictated using voice recognition software.  Despite best efforts to proofread,  errors can occur which can change the documentation meaning.    Darliss Ridgel 04/05/22 Raechel Chute, MD 04/06/22 1137

## 2022-04-05 NOTE — Discharge Instructions (Addendum)
You came to the emergency department today with a Bartholin's gland abscess.  This was drained in the department.  Additionally:  It looks like you have a developing UTI.  Cephalexin is the antibiotic that we will treat this.  This will also cover the abscess that we drained today. You tested positive for bacterial vaginosis.  Metronidazole is the antibiotic that I have sent to your pharmacy.  Do not drink alcohol with this because you will get a very nasty reaction inclusive of vomiting and diarrhea. Your Bartholin's abscess was drained today.  Please follow-up with your OB/GYN for wound check.  Call them tomorrow for an appointment later this week.  I have attached information about these cysts as well is aftercare to these papers.  Please read these.  Do not hesitate to return to the emergency department with any worsening or recurring symptoms.  You may take Tylenol and Motrin for your fevers and pain.  Your work note is attached for the rest of the week due to your COVID.

## 2022-04-05 NOTE — ED Triage Notes (Signed)
Pt arrives with c/o abscesses on her labia. Pt endorses drainage from area. Per pt, the drainage is sometimes bloody or brown.

## 2022-04-05 NOTE — ED Notes (Signed)
Patient transported to CT 

## 2022-04-05 NOTE — Progress Notes (Signed)
[facility] following for sepsis protocol. 

## 2022-04-06 LAB — GC/CHLAMYDIA PROBE AMP (~~LOC~~) NOT AT ARMC
Chlamydia: NEGATIVE
Comment: NEGATIVE
Comment: NORMAL
Neisseria Gonorrhea: POSITIVE — AB

## 2022-04-07 LAB — URINE CULTURE

## 2022-04-09 ENCOUNTER — Other Ambulatory Visit: Payer: Self-pay | Admitting: Psychiatry

## 2022-04-10 LAB — CULTURE, BLOOD (ROUTINE X 2)
Culture: NO GROWTH
Culture: NO GROWTH
Special Requests: ADEQUATE
Special Requests: ADEQUATE

## 2022-04-14 ENCOUNTER — Ambulatory Visit (INDEPENDENT_AMBULATORY_CARE_PROVIDER_SITE_OTHER): Payer: Self-pay | Admitting: Psychiatry

## 2022-04-14 DIAGNOSIS — F909 Attention-deficit hyperactivity disorder, unspecified type: Secondary | ICD-10-CM

## 2022-04-16 NOTE — Progress Notes (Signed)
No show

## 2022-04-20 ENCOUNTER — Ambulatory Visit: Payer: BC Managed Care – PPO | Admitting: Behavioral Health

## 2022-06-06 ENCOUNTER — Emergency Department (HOSPITAL_BASED_OUTPATIENT_CLINIC_OR_DEPARTMENT_OTHER): Payer: BC Managed Care – PPO

## 2022-06-06 ENCOUNTER — Other Ambulatory Visit: Payer: Self-pay

## 2022-06-06 ENCOUNTER — Encounter (HOSPITAL_BASED_OUTPATIENT_CLINIC_OR_DEPARTMENT_OTHER): Payer: Self-pay | Admitting: Emergency Medicine

## 2022-06-06 ENCOUNTER — Emergency Department (HOSPITAL_BASED_OUTPATIENT_CLINIC_OR_DEPARTMENT_OTHER)
Admission: EM | Admit: 2022-06-06 | Discharge: 2022-06-07 | Disposition: A | Discharge status: Home / Self Care | Payer: BC Managed Care – PPO | Attending: Emergency Medicine | Admitting: Emergency Medicine

## 2022-06-06 DIAGNOSIS — R109 Unspecified abdominal pain: Secondary | ICD-10-CM | POA: Diagnosis present

## 2022-06-06 DIAGNOSIS — N12 Tubulo-interstitial nephritis, not specified as acute or chronic: Secondary | ICD-10-CM | POA: Insufficient documentation

## 2022-06-06 DIAGNOSIS — Z20822 Contact with and (suspected) exposure to covid-19: Secondary | ICD-10-CM | POA: Diagnosis not present

## 2022-06-06 LAB — COMPREHENSIVE METABOLIC PANEL
ALT: 23 U/L (ref 0–44)
AST: 26 U/L (ref 15–41)
Albumin: 3.5 g/dL (ref 3.5–5.0)
Alkaline Phosphatase: 50 U/L (ref 38–126)
Anion gap: 6 (ref 5–15)
BUN: 13 mg/dL (ref 6–20)
CO2: 23 mmol/L (ref 22–32)
Calcium: 8.2 mg/dL — ABNORMAL LOW (ref 8.9–10.3)
Chloride: 105 mmol/L (ref 98–111)
Creatinine, Ser: 0.48 mg/dL (ref 0.44–1.00)
GFR, Estimated: >60 mL/min (ref >60)
Glucose, Bld: 99 mg/dL (ref 70–99)
Potassium: 3 mmol/L — ABNORMAL LOW (ref 3.5–5.1)
Sodium: 134 mmol/L — ABNORMAL LOW (ref 135–145)
Total Bilirubin: 0.8 mg/dL (ref 0.3–1.2)
Total Protein: 6.7 g/dL (ref 6.5–8.1)

## 2022-06-06 LAB — CBC WITH DIFFERENTIAL/PLATELET
Abs Immature Granulocytes: 0.04 10*3/uL (ref 0.00–0.07)
Basophils Absolute: 0 10*3/uL (ref 0.0–0.1)
Basophils Relative: 0 %
Eosinophils Absolute: 0 10*3/uL (ref 0.0–0.5)
Eosinophils Relative: 0 %
HCT: 32.5 % — ABNORMAL LOW (ref 36.0–46.0)
Hemoglobin: 10.9 g/dL — ABNORMAL LOW (ref 12.0–15.0)
Immature Granulocytes: 0 %
Lymphocytes Relative: 8 %
Lymphs Abs: 0.8 10*3/uL (ref 0.7–4.0)
MCH: 29.2 pg (ref 26.0–34.0)
MCHC: 33.5 g/dL (ref 30.0–36.0)
MCV: 87.1 fL (ref 80.0–100.0)
Monocytes Absolute: 0.6 10*3/uL (ref 0.1–1.0)
Monocytes Relative: 6 %
Neutro Abs: 9.1 10*3/uL — ABNORMAL HIGH (ref 1.7–7.7)
Neutrophils Relative %: 86 %
Platelets: 168 10*3/uL (ref 150–400)
RBC: 3.73 MIL/uL — ABNORMAL LOW (ref 3.87–5.11)
RDW: 12.3 % (ref 11.5–15.5)
WBC: 10.7 10*3/uL — ABNORMAL HIGH (ref 4.0–10.5)
nRBC: 0 % (ref 0.0–0.2)

## 2022-06-06 LAB — URINALYSIS, ROUTINE W REFLEX MICROSCOPIC
Bilirubin Urine: NEGATIVE
Glucose, UA: NEGATIVE mg/dL
Ketones, ur: NEGATIVE mg/dL
Nitrite: NEGATIVE
Protein, ur: 100 mg/dL — AB
Specific Gravity, Urine: 1.02 (ref 1.005–1.030)
pH: 6 (ref 5.0–8.0)

## 2022-06-06 LAB — URINALYSIS, MICROSCOPIC (REFLEX)

## 2022-06-06 LAB — APTT: aPTT: 35 seconds (ref 24–36)

## 2022-06-06 LAB — RESP PANEL BY RT-PCR (RSV, FLU A&B, COVID)  RVPGX2
Influenza A by PCR: NEGATIVE
Influenza B by PCR: NEGATIVE
Resp Syncytial Virus by PCR: NEGATIVE
SARS Coronavirus 2 by RT PCR: NEGATIVE

## 2022-06-06 LAB — LACTIC ACID, PLASMA: Lactic Acid, Venous: 0.9 mmol/L (ref 0.5–1.9)

## 2022-06-06 LAB — PROTIME-INR
INR: 1.1 (ref 0.8–1.2)
Prothrombin Time: 13.9 seconds (ref 11.4–15.2)

## 2022-06-06 LAB — PREGNANCY, URINE: Preg Test, Ur: NEGATIVE

## 2022-06-06 MED ORDER — LACTATED RINGERS IV BOLUS (SEPSIS)
500.0000 mL | Freq: Once | INTRAVENOUS | Status: AC
  Administered 2022-06-06: 500 mL via INTRAVENOUS

## 2022-06-06 MED ORDER — LORAZEPAM 2 MG/ML IJ SOLN
1.0000 mg | Freq: Once | INTRAMUSCULAR | Status: AC
  Administered 2022-06-06: 1 mg via INTRAVENOUS
  Filled 2022-06-06: qty 1

## 2022-06-06 MED ORDER — KETOROLAC TROMETHAMINE 15 MG/ML IJ SOLN
15.0000 mg | Freq: Once | INTRAMUSCULAR | Status: AC
  Administered 2022-06-06: 15 mg via INTRAVENOUS
  Filled 2022-06-06: qty 1

## 2022-06-06 MED ORDER — CIPROFLOXACIN HCL 500 MG PO TABS: 500.0000 mg | ORAL_TABLET | Freq: Two times a day (BID) | ORAL | 0 refills | Status: DC

## 2022-06-06 MED ORDER — SODIUM CHLORIDE 0.9 % IV SOLN: INTRAVENOUS | Status: DC

## 2022-06-06 MED ORDER — ACETAMINOPHEN 325 MG PO TABS
650.0000 mg | ORAL_TABLET | Freq: Once | ORAL | Status: DC
  Filled 2022-06-06: qty 2

## 2022-06-06 MED ORDER — SODIUM CHLORIDE 0.9 % IV SOLN
2.0000 g | INTRAVENOUS | Status: DC
  Administered 2022-06-06: 2 g via INTRAVENOUS
  Filled 2022-06-06: qty 20

## 2022-06-06 MED ORDER — LACTATED RINGERS IV BOLUS (SEPSIS)
1000.0000 mL | Freq: Once | INTRAVENOUS | Status: AC
  Administered 2022-06-06: 1000 mL via INTRAVENOUS

## 2022-06-06 MED ORDER — ONDANSETRON 4 MG PO TBDP: 4.0000 mg | ORAL_TABLET | Freq: Three times a day (TID) | ORAL | 0 refills | Status: AC | PRN

## 2022-06-06 MED ORDER — LACTATED RINGERS IV BOLUS (SEPSIS)
250.0000 mL | Freq: Once | INTRAVENOUS | Status: AC
  Administered 2022-06-06: 250 mL via INTRAVENOUS

## 2022-06-06 NOTE — Discharge Instructions (Addendum)
You came to the emergency department today with right-sided pain, fevers and chills.  Your CT scan showed evidence of a kidney infection likely started as a urinary tract infection.  You were given IV antibiotics and I am discharging with antibiotics as well.  Take it with food as it may upset your stomach.  Zofran was also sent to your pharmacy for nausea.   Do not hesitate to return with any worsening symptoms, especially fevers you are unable to treat at home with Tylenol and ibuprofen, inability to eat or drink, difficulty urinating or blood in your urine.  It was a pleasure to see you again and I hope you feel better!

## 2022-06-06 NOTE — ED Triage Notes (Signed)
Pt reports RT flank pain x 1 wk; has urinary urgency; sts she has been incontinent of urine x 3

## 2022-06-06 NOTE — ED Provider Notes (Signed)
[facility] Provider Note   CSN: [id number] Arrival date & time: 06/06/22  2011     History  Chief Complaint  Patient presents with   Flank Pain    Claudia Brock is a 22 y.o. female with a past medical history of generalized anxiety disorder presenting today with right-sided back pain.  She is very anxious and unable to provide a consistent history, majority of history is from a friend at bedside who has been taking notes over the past couple of hours.  Patient has complained about back pain for the past week.  She also is endorsing some dysuria without hematuria.  No vaginal symptoms.  She occasionally feels as though the pain in the right side of her back radiates to the front.  Endorsing some intermediate urinary incontinence which she better describes as urgency as well.  Subjective fevers with chills that she has been treating with Tylenol   Flank Pain       Home Medications Prior to Admission medications   Medication Sig Start Date End Date Taking? Authorizing Provider  beclomethasone (QVAR) 80 MCG/ACT inhaler Inhale 2 puffs into the lungs 2 (two) times daily.    [provider]  cetirizine (ZYRTEC) 10 MG tablet Take 10 mg by mouth daily.    [provider]  guaiFENesin-dextromethorphan (ROBITUSSIN DM) 100-10 MG/5ML syrup Take 5 mLs by mouth every 4 (four) hours as needed for cough. 10/26/14   Felicie Morn, NP  metroNIDAZOLE (FLAGYL) 500 MG tablet Take 1 tablet (500 mg total) by mouth 2 (two) times daily. 04/05/22   Nafisa Olds A, PA-C  mirtazapine (REMERON) 15 MG tablet TAKE 1 TABLET BY MOUTH EVERYDAY AT BEDTIME 04/09/22   Kendal Hymen, MD  montelukast (SINGULAIR) 10 MG tablet Take 10 mg by mouth at bedtime.    [provider]  naproxen (NAPROSYN) 375 MG tablet Take 1 tablet (375 mg total) by mouth 2 (two) times daily. 08/17/21   Linwood Dibbles, MD  phenazopyridine (PYRIDIUM) 200 MG tablet Take 1 tablet  (200 mg total) by mouth 3 (three) times daily as needed for pain. 03/06/18   Garlon Hatchet, PA-C      Allergies    Ritalin [methylphenidate hcl]    Review of Systems   Review of Systems  Constitutional:  Positive for chills and fever.  Gastrointestinal:  Positive for nausea. Negative for diarrhea and vomiting.  Genitourinary:  Positive for dysuria, flank pain and urgency. Negative for hematuria, pelvic pain, vaginal bleeding, vaginal discharge and vaginal pain.    Physical Exam Updated Vital Signs BP 120/81 (BP Location: Left Arm)   Pulse (!) 113   Temp (!) 100.6 F (38.1 C) (Oral)   Resp 20   Ht 5\' 7"  (1.702 m)   SpO2 100%   BMI 18.48 kg/m  Physical Exam Vitals and nursing note reviewed.  Constitutional:      Appearance: Normal appearance.  HENT:     Head: Normocephalic and atraumatic.  Eyes:     General: No scleral icterus.    Conjunctiva/sclera: Conjunctivae normal.  Pulmonary:     Effort: Pulmonary effort is normal. No respiratory distress.  Abdominal:     General: Abdomen is flat.     Palpations: Abdomen is soft.     Tenderness: There is right CVA tenderness.     Comments: Generalized abdominal tenderness.  Skin:    Findings: No rash.  Neurological:     Mental Status: She is  alert.  Psychiatric:        Mood and Affect: Mood normal.     ED Results / Procedures / Treatments   Labs (all labs ordered are listed, but only abnormal results are displayed) Labs Reviewed  URINALYSIS, ROUTINE W REFLEX MICROSCOPIC - Abnormal; Notable for the following components:      Result Value   APPearance HAZY (*)    Hgb urine dipstick MODERATE (*)    Protein, ur 100 (*)    Leukocytes,Ua MODERATE (*)    All other components within normal limits  COMPREHENSIVE METABOLIC PANEL - Abnormal; Notable for the following components:   Sodium 134 (*)    Potassium 3.0 (*)    Calcium 8.2 (*)    All other components within normal limits  CBC WITH DIFFERENTIAL/PLATELET - Abnormal;  Notable for the following components:   WBC 10.7 (*)    RBC 3.73 (*)    Hemoglobin 10.9 (*)    HCT 32.5 (*)    Neutro Abs 9.1 (*)    All other components within normal limits  URINALYSIS, MICROSCOPIC (REFLEX) - Abnormal; Notable for the following components:   Bacteria, UA RARE (*)    All other components within normal limits  RESP PANEL BY RT-PCR (RSV, FLU A&B, COVID)  RVPGX2  URINE CULTURE  CULTURE, BLOOD (ROUTINE X 2)  CULTURE, BLOOD (ROUTINE X 2)  LACTIC ACID, PLASMA  PREGNANCY, URINE  PROTIME-INR  APTT  LACTIC ACID, PLASMA    EKG None  Radiology DG Chest 2 View  Result Date: 06/06/2022 CLINICAL DATA:  Suspected Sepsis RT flank pain x 1 wk; has urinary urgency. Smoker. Hx asthma. Fever 100.6. EXAM: CHEST - 2 VIEW COMPARISON:  Chest x-ray 03/06/2018 FINDINGS: The heart and mediastinal contours are within normal limits. No focal consolidation. No pulmonary edema. No pleural effusion. No pneumothorax. No acute osseous abnormality. IMPRESSION: No active cardiopulmonary disease. Electronically Signed   By: Tish Frederickson M.D.   On: 06/06/2022 21:21    Procedures Procedures   Medications Ordered in ED Medications  acetaminophen (TYLENOL) tablet 650 mg (650 mg Oral Not Given 06/06/22 2137)  ketorolac (TORADOL) 15 MG/ML injection 15 mg (has no administration in time range)  0.9 %  sodium chloride infusion (has no administration in time range)  lactated ringers bolus 1,000 mL (1,000 mLs Intravenous New Bag/Given 06/06/22 2137)    And  lactated ringers bolus 500 mL (has no administration in time range)    And  lactated ringers bolus 250 mL (has no administration in time range)  cefTRIAXone (ROCEPHIN) 2 g in sodium chloride 0.9 % 100 mL IVPB (has no administration in time range)  LORazepam (ATIVAN) injection 1 mg (has no administration in time range)    ED Course/ Medical Decision Making/ A&P Clinical Course as of 06/06/22 2352  Sat Jun 06, 2022  2320 I went and spoke with  the patient at bedside about her CT findings.  We discussed that she had pyelonephritis and that this often is treated outpatient however I also could admit her to the hospital.  She preferred to be treated outpatient but provided with a work note. [MR]    Clinical Course User Index [MR] Danean Marner, Gabriel Cirri, PA-C                             Medical Decision Making Amount and/or Complexity of Data Reviewed Labs: ordered. Radiology: ordered. ECG/medicine tests: ordered.  Risk OTC drugs.  Prescription drug management.   22 year old female presenting with right flank pain.  Differential includes but is not limited to nephrolithiasis, pyelonephritis, UTI, STD/PID/TOA, appendicitis.    This is not an exhaustive differential.    Past Medical History / Co-morbidities / Social History: Severe anxiety disruptive to medical care   Additional history: I personally have seen this patient multiple times.  Most recently for a Bartholin's gland abscess.  I did have some concern for STI at that time so she was treated for PID.  Ended up positive for gonorrhea.  She has also had multiple visits for BV in the past and had an episode of pyelonephritis in 2020   Physical Exam: Pertinent physical exam findings include Generalized abdominal tenderness, positive right-sided CVA tenderness, impossible to get a good exam due to patient's severe anxiety  Lab Tests: I ordered, and personally interpreted labs.  The pertinent results include: WBC 10.7 Urinalysis with WBCs, bacteria and leukocytes Normal lactic   Imaging Studies: CT scan with right ureteral prominence.  Pyelo versus recently passed stone.    Medications: Fluids, Ativan, Toradol and Rocephin  MDM/Disposition: This is a 22 year old female who presented today with right-sided flank pain for the past week.  Also complaining of dysuria.  On arrival patient was febrile to 100.6, tachycardic to 113 and tachypneic on my original assessment.   Evolving sepsis protocol was initiated by nursing.  CT scan revealing of pyelo versus recently passed stone.  Patient is having dysuria and had some WBCs/bacteria/leukocytes.  Suspect that she has a pyelonephritis at this time.  Considered admission for this due to febrile nature and tachycardia.  On reassessment patient is no longer tachycardic.  Each time I see her she is extremely anxious and unable to calm down.  I do question whether or not her original tachycardia was somewhat due to her severe anxiety.  Fever was 100.6 which does not quite meet sepsis criteria.  Leukocytosis 10.7 which also does not exactly meet sepsis criteria.  She would fall into a SIRS criteria at this time.  She is feeling much better and I believe it is reasonable to discharge her with outpatient antibiotics.  Treated with 2 g of Rocephin in the emergency department and will be discharged with ciprofloxacin.  She was given Ativan to calm down throughout her treatment in the emergency department and she currently is drowsy.  Not appropriate to be discharged to drive.  She will remain in the exam room and metabolize to freedom.    Final Clinical Impression(s) / ED Diagnoses Final diagnoses:  Pyelonephritis    Rx / DC Orders ED Discharge Orders          Ordered    ciprofloxacin (CIPRO) 500 MG tablet  2 times daily        06/06/22 2342    ondansetron (ZOFRAN-ODT) 4 MG disintegrating tablet  Every 8 hours PRN        06/06/22 2343           Results and diagnoses were explained to the patient. Return precautions discussed in full. Patient had no additional questions and expressed complete understanding.   This chart was dictated using voice recognition software.  Despite best efforts to proofread,  errors can occur which can change the documentation meaning.     Woodroe Chen 06/06/22 2353    Lonell Grandchild, MD 06/07/22 [phone]

## 2022-06-06 NOTE — ED Notes (Signed)
Patient taken to CT.

## 2022-06-06 NOTE — ED Notes (Signed)
Unable to obtain blood work and blood cultures at this time due to patient's severe anxiety.  PA aware

## 2022-06-07 LAB — CULTURE, BLOOD (ROUTINE X 2)

## 2022-06-07 LAB — URINE CULTURE

## 2022-06-07 NOTE — ED Notes (Addendum)
Woke pt up and gave coffee. Pt easily roused from sleep. Encouraged to sit up, drink the coffee.

## 2022-06-07 NOTE — ED Notes (Signed)
Patient currently sleeping.  Will wake to voice.  Remains drowsy

## 2022-06-07 NOTE — ED Notes (Signed)
Patient remains drowsy.  Encourage to eat and drink.  Will reassess shortly

## 2022-06-08 LAB — CULTURE, BLOOD (ROUTINE X 2)
Culture: NO GROWTH
Special Requests: ADEQUATE

## 2022-06-08 LAB — URINE CULTURE: Culture: >=100000 — AB

## 2022-06-09 ENCOUNTER — Telehealth (HOSPITAL_BASED_OUTPATIENT_CLINIC_OR_DEPARTMENT_OTHER): Payer: Self-pay | Admitting: *Deleted

## 2022-06-09 LAB — CULTURE, BLOOD (ROUTINE X 2)
Culture: NO GROWTH
Special Requests: ADEQUATE

## 2022-06-09 NOTE — Telephone Encounter (Signed)
Post ED Visit - Positive Culture Follow-up  Culture report reviewed by antimicrobial stewardship pharmacist: [facility] [x]  Francene Finders Dohlen Pharm.D. []  Celedonio Miyamoto, Pharm.D., BCPS AQ-ID []  Garvin Fila, Pharm.D., BCPS []  Georgina Pillion, Pharm.D., BCPS []  [place], [address].D., BCPS, AAHIVP []  Estella Husk, Pharm.D., BCPS, AAHIVP []  Lysle Pearl, PharmD, BCPS []  Phillips Climes, PharmD, BCPS []  Agapito Games, PharmD, BCPS []  Verlan Friends, PharmD []  Mervyn Gay, PharmD, BCPS []  Vinnie Level, PharmD  [facility] []  Len Childs, PharmD []  Greer Pickerel, PharmD []  Adalberto Cole, PharmD []  Perlie Gold, Rph []  Lonell Face) Jean Rosenthal, PharmD []  Earl Many, PharmD []  Junita Push, PharmD []  Dorna Leitz, PharmD []  Terrilee Files, PharmD []  Lynann Beaver, PharmD []  Keturah Barre, PharmD []  Loralee Pacas, PharmD []  Bernadene Person, PharmD   Positive urine culture Treated with Ciprofloxacin HCL, organism sensitive to the same and no further patient follow-up is required at this time.  [facility] 06/09/2022, 10:04 AM

## 2022-06-12 LAB — CULTURE, BLOOD (ROUTINE X 2)

## 2023-04-07 ENCOUNTER — Ambulatory Visit
Admission: EM | Admit: 2023-04-07 | Discharge: 2023-04-07 | Disposition: A | Discharge status: Home / Self Care | Payer: BC Managed Care – PPO | Attending: Family Medicine | Admitting: Family Medicine

## 2023-04-07 DIAGNOSIS — J0110 Acute frontal sinusitis, unspecified: Secondary | ICD-10-CM | POA: Diagnosis not present

## 2023-04-07 DIAGNOSIS — J209 Acute bronchitis, unspecified: Secondary | ICD-10-CM | POA: Diagnosis not present

## 2023-04-07 MED ORDER — FLUTICASONE PROPIONATE 50 MCG/ACT NA SUSP
1.0000 | Freq: Every day | NASAL | 0 refills | Status: AC
Start: 2023-04-07 — End: ?

## 2023-04-07 MED ORDER — AMOXICILLIN-POT CLAVULANATE 875-125 MG PO TABS
1.0000 | ORAL_TABLET | Freq: Two times a day (BID) | ORAL | 0 refills | Status: AC
Start: 2023-04-07 — End: ?

## 2023-04-07 MED ORDER — BENZONATATE 200 MG PO CAPS
200.0000 mg | ORAL_CAPSULE | Freq: Three times a day (TID) | ORAL | 0 refills | Status: AC | PRN
Start: 2023-04-07 — End: ?

## 2023-04-07 NOTE — ED Triage Notes (Signed)
 Patient presents to office nasal congestion and headache x 1 week. Denies any recent fever. Patient has not taken any medications.

## 2023-04-07 NOTE — Discharge Instructions (Signed)
 Start Augmentin twice daily for 7 days.  Flonase daily.  You may take Tessalon as needed for your cough.  Lots of rest and fluids.  Please follow-up with your PCP if your symptoms do not improve.  Please go to the ER for any worsening symptoms.  Hope you feel better soon!

## 2023-04-07 NOTE — ED Provider Notes (Signed)
 [facility]    CSN: [id number] Arrival date & time: 04/07/23  1117      History   Chief Complaint No chief complaint on file.   HPI Claudia Brock is a 23 y.o. female  presents for evaluation of URI symptoms for 7 days. Patient reports associated symptoms of cough, congestion, sinus pressure/pain with purulent nasal drainage, headache, nausea. Denies vomiting, diarrhea, fevers, ear pain, body aches, shortness of breath patient does have a hx of asthma.  Denies wheezing or shortness of breath.  Does have an inhaler but has not needed to use since symptom onset.  Patient is an active smoker.   Reports sick contacts via work.  Pt has taken nothing OTC for symptoms. Pt has no other concerns at this time.   HPI  Past Medical History:  Diagnosis Date   ADHD (attention deficit hyperactivity disorder)    Asthma    Eczema    Seizures (HCC)     Patient Active Problem List   Diagnosis Date Noted   PTSD (post-traumatic stress disorder) 03/18/2022   Generalized anxiety disorder 03/18/2022   Major depressive disorder, recurrent episode, moderate (HCC) 03/18/2022    History reviewed. No pertinent surgical history.  OB History   No obstetric history on file.      Home Medications    Prior to Admission medications   Medication Sig Start Date End Date Taking? Authorizing Provider  amoxicillin-clavulanate (AUGMENTIN) 875-125 MG tablet Take 1 tablet by mouth every 12 (twelve) hours. 04/07/23  Yes Radford Pax, NP  benzonatate (TESSALON) 200 MG capsule Take 1 capsule (200 mg total) by mouth 3 (three) times daily as needed. 04/07/23  Yes Radford Pax, NP  fluticasone (FLONASE) 50 MCG/ACT nasal spray Place 1 spray into both nostrils daily. 04/07/23  Yes Radford Pax, NP  beclomethasone (QVAR) 80 MCG/ACT inhaler Inhale 2 puffs into the lungs 2 (two) times daily.    [provider]  cetirizine (ZYRTEC) 10 MG tablet Take 10 mg by mouth daily.    [provider]   guaiFENesin-dextromethorphan (ROBITUSSIN DM) 100-10 MG/5ML syrup Take 5 mLs by mouth every 4 (four) hours as needed for cough. 10/26/14   Felicie Morn, NP  mirtazapine (REMERON) 15 MG tablet TAKE 1 TABLET BY MOUTH EVERYDAY AT BEDTIME 04/09/22   Kendal Hymen, MD  montelukast (SINGULAIR) 10 MG tablet Take 10 mg by mouth at bedtime.    [provider]  naproxen (NAPROSYN) 375 MG tablet Take 1 tablet (375 mg total) by mouth 2 (two) times daily. 08/17/21   Linwood Dibbles, MD  ondansetron (ZOFRAN-ODT) 4 MG disintegrating tablet Take 1 tablet (4 mg total) by mouth every 8 (eight) hours as needed for nausea or vomiting. 06/06/22   Redwine, Madison A, PA-C  phenazopyridine (PYRIDIUM) 200 MG tablet Take 1 tablet (200 mg total) by mouth 3 (three) times daily as needed for pain. 03/06/18   Garlon Hatchet, PA-C    Family History History reviewed. No pertinent family history.  Social History Social History   Tobacco Use   Smoking status: Never   Smokeless tobacco: Never  Vaping Use   Vaping status: Every Day  Substance Use Topics   Alcohol use: Yes    Comment: rare   Drug use: Not Currently    Types: Marijuana     Allergies   Ritalin [methylphenidate hcl]   Review of Systems Review of Systems  HENT:  Positive for congestion, sinus pressure and sinus pain.  Respiratory:  Positive for cough.   Neurological:  Positive for headaches.     Physical Exam Triage Vital Signs ED Triage Vitals [04/07/23 1320]  Encounter Vitals Group     BP 129/87     Systolic BP Percentile      Diastolic BP Percentile      Pulse Rate (!) 101     Resp 16     Temp 98.6 F (37 C)     Temp Source Oral     SpO2 98 %     Weight      Height      Head Circumference      Peak Flow      Pain Score      Pain Loc      Pain Education      Exclude from Growth Chart    No data found.  Updated Vital Signs BP 129/87 (BP Location: Left Arm)   Pulse (!) 101   Temp 98.6 F (37 C) (Oral)   Resp 16   SpO2  98%   Visual Acuity Right Eye Distance:   Left Eye Distance:   Bilateral Distance:    Right Eye Near:   Left Eye Near:    Bilateral Near:     Physical Exam Vitals and nursing note reviewed.  Constitutional:      General: She is not in acute distress.    Appearance: She is well-developed. She is not ill-appearing.  HENT:     Head: Normocephalic and atraumatic.     Right Ear: Tympanic membrane and ear canal normal.     Left Ear: Tympanic membrane and ear canal normal.     Nose: Congestion present.     Right Turbinates: Swollen and pale.     Left Turbinates: Swollen and pale.     Right Sinus: Frontal sinus tenderness present.     Left Sinus: Frontal sinus tenderness present.     Mouth/Throat:     Mouth: Mucous membranes are moist.     Pharynx: Oropharynx is clear. Uvula midline. No posterior oropharyngeal erythema.     Tonsils: No tonsillar exudate or tonsillar abscesses.  Eyes:     Conjunctiva/sclera: Conjunctivae normal.     Pupils: Pupils are equal, round, and reactive to light.  Cardiovascular:     Rate and Rhythm: Normal rate and regular rhythm.     Heart sounds: Normal heart sounds.  Pulmonary:     Effort: Pulmonary effort is normal.     Breath sounds: Normal breath sounds. No wheezing.  Musculoskeletal:     Cervical back: Normal range of motion and neck supple.  Lymphadenopathy:     Cervical: No cervical adenopathy.  Skin:    General: Skin is warm and dry.  Neurological:     General: No focal deficit present.     Mental Status: She is alert and oriented to person, place, and time.  Psychiatric:        Mood and Affect: Mood normal.        Behavior: Behavior normal.      UC Treatments / Results  Labs (all labs ordered are listed, but only abnormal results are displayed) Labs Reviewed - No data to display  EKG   Radiology No results found.  Procedures Procedures (including critical care time)  Medications Ordered in UC Medications - No data to  display  Initial Impression / Assessment and Plan / UC Course  I have reviewed the triage vital signs and the nursing notes.  Pertinent labs & imaging results that were available during my care of the patient were reviewed by me and considered in my medical decision making (see chart for details).     Reviewed exam and symptoms with patient.  No red flags.  Will start Augmentin for sinusitis.  Tessalon as needed cough and Flonase daily.  Discussed nasal rinses as tolerated.  Patient has albuterol inhaler and will use if needed for asthma symptoms which she currently denies.  Encourage rest and fluids.  PCP follow-up if symptoms do not improve.  ER precautions reviewed and patient verbalized understanding. Final Clinical Impressions(s) / UC Diagnoses   Final diagnoses:  Acute frontal sinusitis, recurrence not specified  Acute bronchitis, unspecified organism     Discharge Instructions      Start Augmentin twice daily for 7 days.  Flonase daily.  You may take Tessalon as needed for your cough.  Lots of rest and fluids.  Please follow-up with your PCP if your symptoms do not improve.  Please go to the ER for any worsening symptoms.  Hope you feel better soon!   ED Prescriptions     Medication Sig Dispense Auth. Provider   amoxicillin-clavulanate (AUGMENTIN) 875-125 MG tablet Take 1 tablet by mouth every 12 (twelve) hours. 14 tablet Radford Pax, NP   fluticasone (FLONASE) 50 MCG/ACT nasal spray Place 1 spray into both nostrils daily. 15.8 mL Radford Pax, NP   benzonatate (TESSALON) 200 MG capsule Take 1 capsule (200 mg total) by mouth 3 (three) times daily as needed. 20 capsule Radford Pax, NP      PDMP not reviewed this encounter.   Radford Pax, NP 04/07/23 [phone]

## 2024-02-20 ENCOUNTER — Encounter (HOSPITAL_BASED_OUTPATIENT_CLINIC_OR_DEPARTMENT_OTHER): Payer: Self-pay

## 2024-02-20 ENCOUNTER — Other Ambulatory Visit: Payer: Self-pay

## 2024-02-20 ENCOUNTER — Emergency Department (HOSPITAL_BASED_OUTPATIENT_CLINIC_OR_DEPARTMENT_OTHER)
Admission: EM | Admit: 2024-02-20 | Discharge: 2024-02-20 | Disposition: A | Discharge status: Home / Self Care | Attending: Emergency Medicine | Admitting: Emergency Medicine

## 2024-02-20 DIAGNOSIS — R059 Cough, unspecified: Secondary | ICD-10-CM | POA: Diagnosis present

## 2024-02-20 DIAGNOSIS — R519 Headache, unspecified: Secondary | ICD-10-CM | POA: Diagnosis not present

## 2024-02-20 DIAGNOSIS — Z7951 Long term (current) use of inhaled steroids: Secondary | ICD-10-CM | POA: Diagnosis not present

## 2024-02-20 DIAGNOSIS — J3489 Other specified disorders of nose and nasal sinuses: Secondary | ICD-10-CM | POA: Insufficient documentation

## 2024-02-20 DIAGNOSIS — J029 Acute pharyngitis, unspecified: Secondary | ICD-10-CM | POA: Diagnosis not present

## 2024-02-20 DIAGNOSIS — R6889 Other general symptoms and signs: Secondary | ICD-10-CM

## 2024-02-20 DIAGNOSIS — J45909 Unspecified asthma, uncomplicated: Secondary | ICD-10-CM | POA: Diagnosis not present

## 2024-02-20 DIAGNOSIS — M791 Myalgia, unspecified site: Secondary | ICD-10-CM | POA: Diagnosis not present

## 2024-02-20 DIAGNOSIS — R0981 Nasal congestion: Secondary | ICD-10-CM | POA: Insufficient documentation

## 2024-02-20 LAB — RESP PANEL BY RT-PCR (RSV, FLU A&B, COVID)  RVPGX2
Influenza A by PCR: NEGATIVE
Influenza B by PCR: NEGATIVE
Resp Syncytial Virus by PCR: NEGATIVE
SARS Coronavirus 2 by RT PCR: NEGATIVE

## 2024-02-20 NOTE — Discharge Instructions (Addendum)
 Your respitarory panel is negative on today's visit, however there are multiple different viruses out there right now.  Please continue to take medication to help with your symptoms.

## 2024-02-20 NOTE — ED Triage Notes (Signed)
 Pt presents via POV c/o cough, headache, and body aches since Friday.

## 2024-02-20 NOTE — ED Provider Notes (Signed)
 "  EMERGENCY DEPARTMENT AT [facility] Provider Note   CSN: [id number] Arrival date & time: 02/20/24  2151     Patient presents with: URI   Claudia Brock is a 24 y.o. female.   24 y.o female with a PMH of Asthma presents to the ED with a chief complaint of nasal congestion, cough, and body aches which has been going for the past 3 days.  She has taken over-the-counter medications such as NyQuil, DayQuil, Tylenol  without much improvement in symptoms.  In addition, has taken her inhaler without any improvement in symptoms.  Patient reports that she usually gets a sinus infection, has felt some pressure around her nose.  She has not had any fever at home.  She was at work today, reports that she felt too unwell to continue working.  No chest pain, no shortness of breath, no other complaints reported.  The history is provided by the patient.  URI Presenting symptoms: cough, rhinorrhea and sore throat   Presenting symptoms: no fever        Prior to Admission medications  Medication Sig Start Date End Date Taking? Authorizing Provider  amoxicillin -clavulanate (AUGMENTIN ) 875-125 MG tablet Take 1 tablet by mouth every 12 (twelve) hours. 04/07/23   Mayer, Jodi R, NP  beclomethasone (QVAR) 80 MCG/ACT inhaler Inhale 2 puffs into the lungs 2 (two) times daily.    [provider]  benzonatate  (TESSALON ) 200 MG capsule Take 1 capsule (200 mg total) by mouth 3 (three) times daily as needed. 04/07/23   Mayer, Jodi R, NP  cetirizine (ZYRTEC) 10 MG tablet Take 10 mg by mouth daily.    [provider]  fluticasone  (FLONASE ) 50 MCG/ACT nasal spray Place 1 spray into both nostrils daily. 04/07/23   Mayer, Jodi R, NP  guaiFENesin -dextromethorphan (ROBITUSSIN DM) 100-10 MG/5ML syrup Take 5 mLs by mouth every 4 (four) hours as needed for cough. 10/26/14   Claudene Lenis, NP  mirtazapine  (REMERON ) 15 MG tablet TAKE 1 TABLET BY MOUTH EVERYDAY AT BEDTIME 04/09/22   Hansen, Jason  S, MD  montelukast (SINGULAIR) 10 MG tablet Take 10 mg by mouth at bedtime.    [provider]  naproxen  (NAPROSYN ) 375 MG tablet Take 1 tablet (375 mg total) by mouth 2 (two) times daily. 08/17/21   Randol Simmonds, MD  ondansetron  (ZOFRAN -ODT) 4 MG disintegrating tablet Take 1 tablet (4 mg total) by mouth every 8 (eight) hours as needed for nausea or vomiting. 06/06/22   Redwine, Madison A, PA-C  phenazopyridine  (PYRIDIUM ) 200 MG tablet Take 1 tablet (200 mg total) by mouth 3 (three) times daily as needed for pain. 03/06/18   Jarold Olam HERO, PA-C    Allergies: Ritalin [methylphenidate hcl]    Review of Systems  Constitutional:  Negative for chills and fever.  HENT:  Positive for rhinorrhea, sinus pressure and sore throat.   Respiratory:  Positive for cough. Negative for shortness of breath.   Cardiovascular:  Negative for chest pain.  Gastrointestinal:  Negative for abdominal pain.    Updated Vital Signs BP (!) 151/86   Pulse 95   Temp 98.4 F (36.9 C) (Oral)   Resp 14   SpO2 98%   Physical Exam Vitals and nursing note reviewed.  Constitutional:      Appearance: Normal appearance.  HENT:     Head: Normocephalic.     Nose: Congestion present.     Mouth/Throat:     Mouth: Mucous membranes are moist.  Pharynx: Posterior oropharyngeal erythema present. No oropharyngeal exudate.  Cardiovascular:     Rate and Rhythm: Normal rate.  Pulmonary:     Effort: Pulmonary effort is normal.     Breath sounds: No wheezing or rales.  Abdominal:     General: Abdomen is flat.  Musculoskeletal:     Cervical back: Normal range of motion and neck supple.  Skin:    General: Skin is warm and dry.  Neurological:     Mental Status: She is oriented to person, place, and time.     (all labs ordered are listed, but only abnormal results are displayed) Labs Reviewed  RESP PANEL BY RT-PCR (RSV, FLU A&B, COVID)  [id number]    EKG: None  Radiology: No results found.   Procedures    Medications Ordered in the ED - No data to display                                  Medical Decision Making   Patient presented to ED with chief complaint of upper respiratory symptoms such as nasal congestion, cough, runny nose and sore throat that have been ongoing for the past 3 days.  She reports she did not have an influenza vaccine this season.  No alleviating factors despite multiple rounds of over-the-counter medications.  Afebrile here, no hypoxia, no tachycardia, no fever.  She is otherwise hemodynamically stable.  Her lungs are clear to auscultation without any wheezing, she has been using her inhaler with mild improvement in her symptoms.  Abdomen is soft, nontender to palpation.  Oropharynx is clear aside from some mild erythema noted no tonsillar exudate or PTA noted.  Discussed likely viral etiology, respiratory panel is negative.  Patient continue symptomatic treatment, will give her a couple of days off to help recovery. Patient is stable for discharge.   Portions of this note were generated with Scientist, clinical (histocompatibility and immunogenetics). Dictation errors may occur despite best attempts at proofreading.   Final diagnoses:  Flu-like symptoms    ED Discharge Orders     None          Tianni Escamilla, PA-C 02/20/24 2254    Patt Alm Macho, MD 02/20/24 [phone]  "
